# Patient Record
Sex: Female | Born: 1965 | Race: White | Hispanic: No | Marital: Married | Smoking: Never smoker
Health system: Southern US, Community
[De-identification: ages and names within clinical notes are randomized; demographics above are authoritative.]

## PROBLEM LIST (undated history)

## (undated) DIAGNOSIS — I10 Essential (primary) hypertension: Secondary | ICD-10-CM

## (undated) DIAGNOSIS — I639 Cerebral infarction, unspecified: Secondary | ICD-10-CM

## (undated) HISTORY — PX: HERNIA REPAIR: SHX51

## (undated) HISTORY — PX: TUBAL LIGATION: SHX77

## (undated) HISTORY — PX: CARDIAC SURGERY: SHX584

---

## 2015-10-08 ENCOUNTER — Encounter (HOSPITAL_COMMUNITY): Payer: Self-pay | Admitting: Emergency Medicine

## 2015-10-08 ENCOUNTER — Emergency Department (HOSPITAL_COMMUNITY): Payer: No Typology Code available for payment source

## 2015-10-08 ENCOUNTER — Emergency Department (HOSPITAL_COMMUNITY)
Admission: EM | Admit: 2015-10-08 | Discharge: 2015-10-08 | Disposition: A | Discharge status: Home / Self Care | Payer: No Typology Code available for payment source | Attending: Emergency Medicine | Admitting: Emergency Medicine

## 2015-10-08 DIAGNOSIS — Y9389 Activity, other specified: Secondary | ICD-10-CM | POA: Diagnosis not present

## 2015-10-08 DIAGNOSIS — Y92410 Unspecified street and highway as the place of occurrence of the external cause: Secondary | ICD-10-CM | POA: Diagnosis not present

## 2015-10-08 DIAGNOSIS — R0789 Other chest pain: Secondary | ICD-10-CM

## 2015-10-08 DIAGNOSIS — Z8673 Personal history of transient ischemic attack (TIA), and cerebral infarction without residual deficits: Secondary | ICD-10-CM | POA: Insufficient documentation

## 2015-10-08 DIAGNOSIS — Y998 Other external cause status: Secondary | ICD-10-CM | POA: Diagnosis not present

## 2015-10-08 DIAGNOSIS — S29001A Unspecified injury of muscle and tendon of front wall of thorax, initial encounter: Secondary | ICD-10-CM | POA: Insufficient documentation

## 2015-10-08 DIAGNOSIS — S6992XA Unspecified injury of left wrist, hand and finger(s), initial encounter: Secondary | ICD-10-CM | POA: Diagnosis present

## 2015-10-08 DIAGNOSIS — I10 Essential (primary) hypertension: Secondary | ICD-10-CM | POA: Diagnosis not present

## 2015-10-08 DIAGNOSIS — M79642 Pain in left hand: Secondary | ICD-10-CM

## 2015-10-08 HISTORY — DX: Cerebral infarction, unspecified: I63.9

## 2015-10-08 HISTORY — DX: Essential (primary) hypertension: I10

## 2015-10-08 MED ORDER — ACETAMINOPHEN 325 MG PO TABS
650.0000 mg | ORAL_TABLET | Freq: Once | ORAL | Status: AC
  Administered 2015-10-08: 650 mg via ORAL
  Filled 2015-10-08: qty 2

## 2015-10-08 MED ORDER — IBUPROFEN 800 MG PO TABS: 800.0000 mg | ORAL_TABLET | Freq: Three times a day (TID) | ORAL | Status: DC

## 2015-10-08 MED ORDER — IBUPROFEN 400 MG PO TABS
800.0000 mg | ORAL_TABLET | Freq: Once | ORAL | Status: DC
  Filled 2015-10-08: qty 2

## 2015-10-08 MED ORDER — OXYCODONE-ACETAMINOPHEN 5-325 MG PO TABS: 2.0000 | ORAL_TABLET | ORAL | Status: AC | PRN

## 2015-10-08 MED ORDER — METHOCARBAMOL 500 MG PO TABS: 500.0000 mg | ORAL_TABLET | Freq: Two times a day (BID) | ORAL | Status: AC
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
